# Patient Record
Sex: Male | Born: 2005
Health system: Northeastern US, Academic
[De-identification: ages and names within clinical notes are randomized; demographics above are authoritative.]

## PROBLEM LIST (undated history)

## (undated) DIAGNOSIS — J45909 Unspecified asthma, uncomplicated: Secondary | ICD-10-CM

## (undated) DIAGNOSIS — J40 Bronchitis, not specified as acute or chronic: Secondary | ICD-10-CM

---
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
## 2025-02-12 ENCOUNTER — Ambulatory Visit: Admitting: Pediatrics
